# Patient Record
Sex: Female | Born: 1980 | Race: Black or African American | Hispanic: No | Smoking: Current every day smoker
Health system: Southern US, Community
[De-identification: ages and names within clinical notes are randomized; demographics above are authoritative.]

---

## 2019-08-19 ENCOUNTER — Emergency Department (HOSPITAL_COMMUNITY): Payer: Self-pay

## 2019-08-19 ENCOUNTER — Other Ambulatory Visit: Payer: Self-pay

## 2019-08-19 ENCOUNTER — Encounter (HOSPITAL_COMMUNITY): Payer: Self-pay | Admitting: Emergency Medicine

## 2019-08-19 ENCOUNTER — Emergency Department (HOSPITAL_COMMUNITY)
Admission: EM | Admit: 2019-08-19 | Discharge: 2019-08-19 | Disposition: A | Discharge status: Home / Self Care | Payer: Self-pay | Attending: Emergency Medicine | Admitting: Emergency Medicine

## 2019-08-19 DIAGNOSIS — F17210 Nicotine dependence, cigarettes, uncomplicated: Secondary | ICD-10-CM | POA: Insufficient documentation

## 2019-08-19 DIAGNOSIS — F1210 Cannabis abuse, uncomplicated: Secondary | ICD-10-CM | POA: Insufficient documentation

## 2019-08-19 DIAGNOSIS — M7051 Other bursitis of knee, right knee: Secondary | ICD-10-CM | POA: Insufficient documentation

## 2019-08-19 DIAGNOSIS — Y939 Activity, unspecified: Secondary | ICD-10-CM | POA: Insufficient documentation

## 2019-08-19 LAB — I-STAT BETA HCG BLOOD, ED (MC, WL, AP ONLY): I-stat hCG, quantitative: <5 m[IU]/mL (ref <5)

## 2019-08-19 MED ORDER — IBUPROFEN 600 MG PO TABS: 600.0000 mg | ORAL_TABLET | Freq: Four times a day (QID) | ORAL | 0 refills | Status: AC | PRN

## 2019-08-19 MED ORDER — KETOROLAC TROMETHAMINE 2 % EX GEL: CUTANEOUS | 0 refills | Status: AC

## 2019-08-19 NOTE — ED Provider Notes (Signed)
[facility] Note   CSN: [id number] Arrival date & time: 08/19/19  0719     History   Chief Complaint Chief Complaint  Patient presents with  . Joint Swelling    HPI Deborah Gay is a 38 y.o. female.     The history is provided by the patient. No language interpreter was used.     [age] year old female presenting for evaluation of right knee swelling.  Patient report for the past 5 days she noticed increased swelling to both knees right greater than left.  She described pain as a throbbing sensation, worse with prolonged standing with walking and moderate in severity.  She noticed that the swelling in her left knee has decreased in size and more tolerable but the right side has not.  She did not complain of any associated fever, no IV drug use, no back pain or ankle pain and no recent injury.  She has been using Biofreeze cream at home without adequate relief.  She denies any chest pain or shortness of breath no prior history of PE or DVT no recent surgery, prolonged bedrest, active cancer, or taking oral birth control pill.  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  History reviewed. No pertinent past medical history.  There are no active problems to display for this patient.   History reviewed. No pertinent surgical history.   OB History   No obstetric history on file.      Home Medications    Prior to Admission medications   Not on File    Family History History reviewed. No pertinent family history.  Social History Social History   Tobacco Use  . Smoking status: Current Every Day Smoker    Packs/day: 1.00  Substance Use Topics  . Alcohol use: Never    Frequency: Never  . Drug use: Yes    Types: Marijuana    Comment: occasional     Allergies   Patient has no known allergies.   Review of Systems Review of Systems  Constitutional: Negative for fever.  Musculoskeletal: Positive  for joint swelling.  Neurological: Negative for numbness.     Physical Exam Updated Vital Signs BP (!) 192/110 (BP Location: Left Arm)   Pulse 91   Temp 98.7 F (37.1 C) (Oral)   Resp 18   Ht 5\' 7"  (1.702 m)   Wt 102.1 kg   LMP 08/17/2019   SpO2 100%   BMI 35.24 kg/m   Physical Exam Vitals signs and nursing note reviewed.  Constitutional:      General: She is not in acute distress.    Appearance: She is well-developed.  HENT:     Head: Atraumatic.  Eyes:     Conjunctiva/sclera: Conjunctivae normal.  Neck:     Musculoskeletal: Neck supple.  Musculoskeletal:        General: Swelling (Right knee: Moderate edema noted to suprapatellar region on palpation without warmth or erythema.  Normal knee flexion extension, no joint laxity, patella located) present.     Comments: Leg compartment is soft.  Left knee nontender to palpation.  Skin:    Findings: No rash.  Neurological:     Mental Status: She is alert.      ED Treatments / Results  Labs (all labs ordered are listed, but only abnormal results are displayed) Labs Reviewed  I-STAT BETA HCG BLOOD, ED ([facility], [facility], [facility] ONLY)    EKG None  Radiology Dg Knee Complete 4  Views Right  Result Date: 08/19/2019 CLINICAL DATA:  Right knee pain and swelling. EXAM: RIGHT KNEE - COMPLETE 4+ VIEW COMPARISON:  None. FINDINGS: No evidence of fracture or dislocation. Large knee joint effusion is seen. Mild medial compartment degenerative spurring is noted without significant joint space narrowing. No other bone lesions identified. IMPRESSION: 1. Large knee joint effusion. 2. Mild medial compartment degenerative spurring. Electronically Signed   By: Marlaine Hind M.D.   On: 08/19/2019 08:25    Procedures Procedures (including critical care time)  Medications Ordered in ED Medications - No data to display   Initial Impression / Assessment and Plan / ED Course  I have reviewed the triage vital signs and the nursing notes.  Pertinent  labs & imaging results that were available during my care of the patient were reviewed by me and considered in my medical decision making (see chart for details).        BP (!) 151/91 (BP Location: Left Arm)   Pulse 91   Temp 98.7 F (37.1 C) (Oral)   Resp 18   Ht 5\' 7"  (1.702 m)   Wt 102.1 kg   LMP 08/17/2019   SpO2 100%   BMI 35.24 kg/m    Final Clinical Impressions(s) / ED Diagnoses   Final diagnoses:  Suprapatellar bursitis of right knee    ED Discharge Orders         Ordered    ibuprofen (ADVIL) 600 MG tablet  Every 6 hours PRN     08/19/19 1011    Ketorolac Tromethamine 2 % GEL     08/19/19 1011         9:57 AM Patient here with atraumatic right knee pain and swelling.  Initial complaint was bilateral knee pain right greater than left.  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  X-ray of R knee demonstrated a large knee effusion.  Effusion is most noticeable to the suprapatellar region suggestive of potential suprapatellar bursitis.  No evidence to suggest septic joint.  No recent injury to suggest fracture or dislocation.  Will provide knee sleeves, rice therapy, and orthopedic referral.  Work note provided.   Domenic Moras, PA-C 08/19/19 1012    Maudie Flakes, MD 08/20/19 [phone]

## 2019-08-19 NOTE — ED Triage Notes (Signed)
Pt reports right knee swelling and calf pain for the last 2 days. Denies recent fever/ injury. Distal circulation intact.

## 2021-03-27 IMAGING — CR DG KNEE COMPLETE 4+V*R*
4 series · 4 of 4 positions shown · non-contrast
Comparison: None.

CLINICAL DATA: Right knee pain and swelling.

EXAM:
RIGHT KNEE - COMPLETE 4+ VIEW

[knee ap]
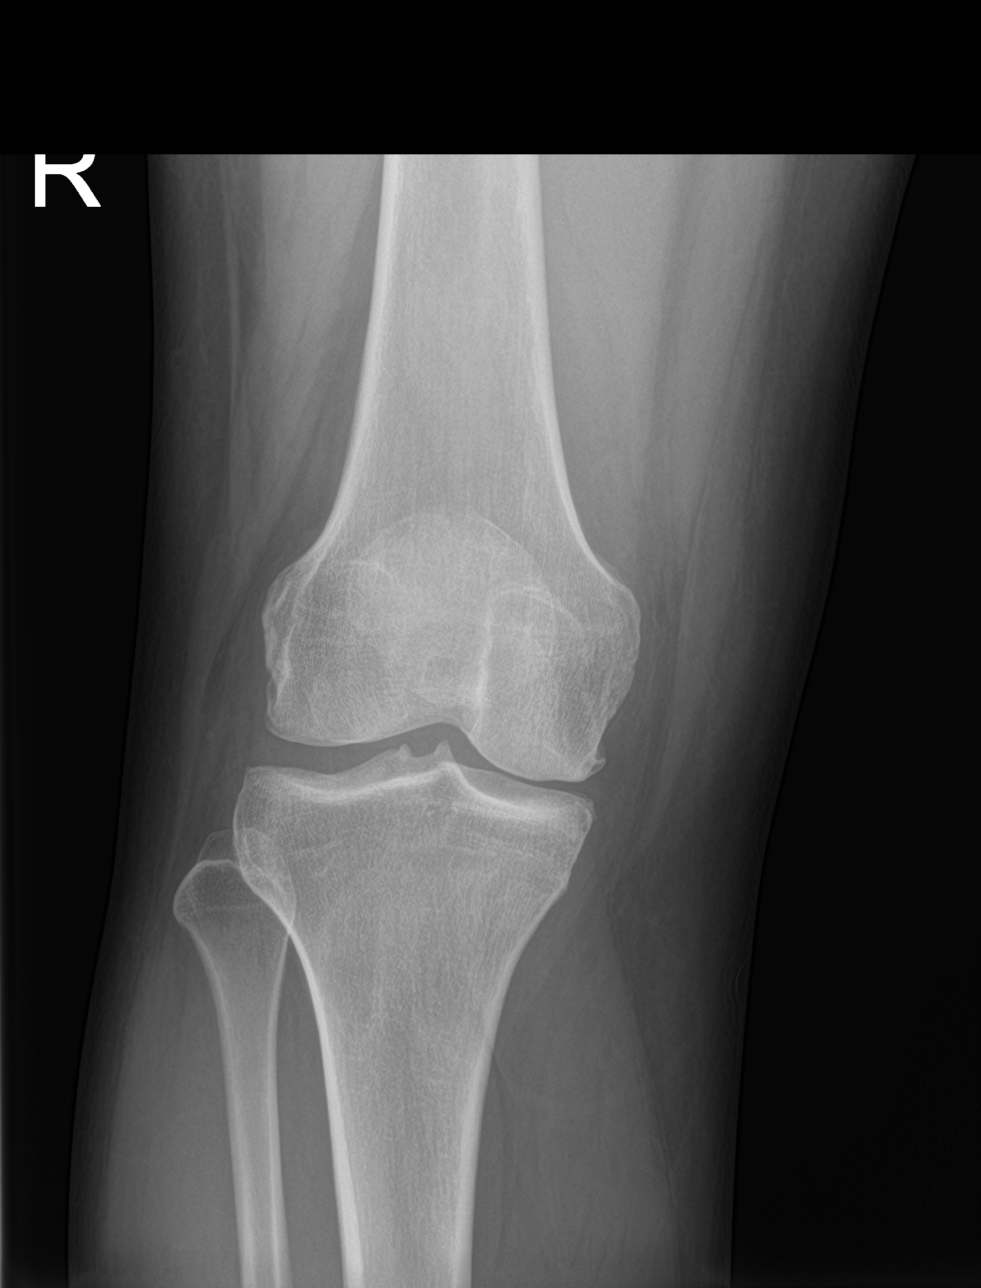

[knee lat]
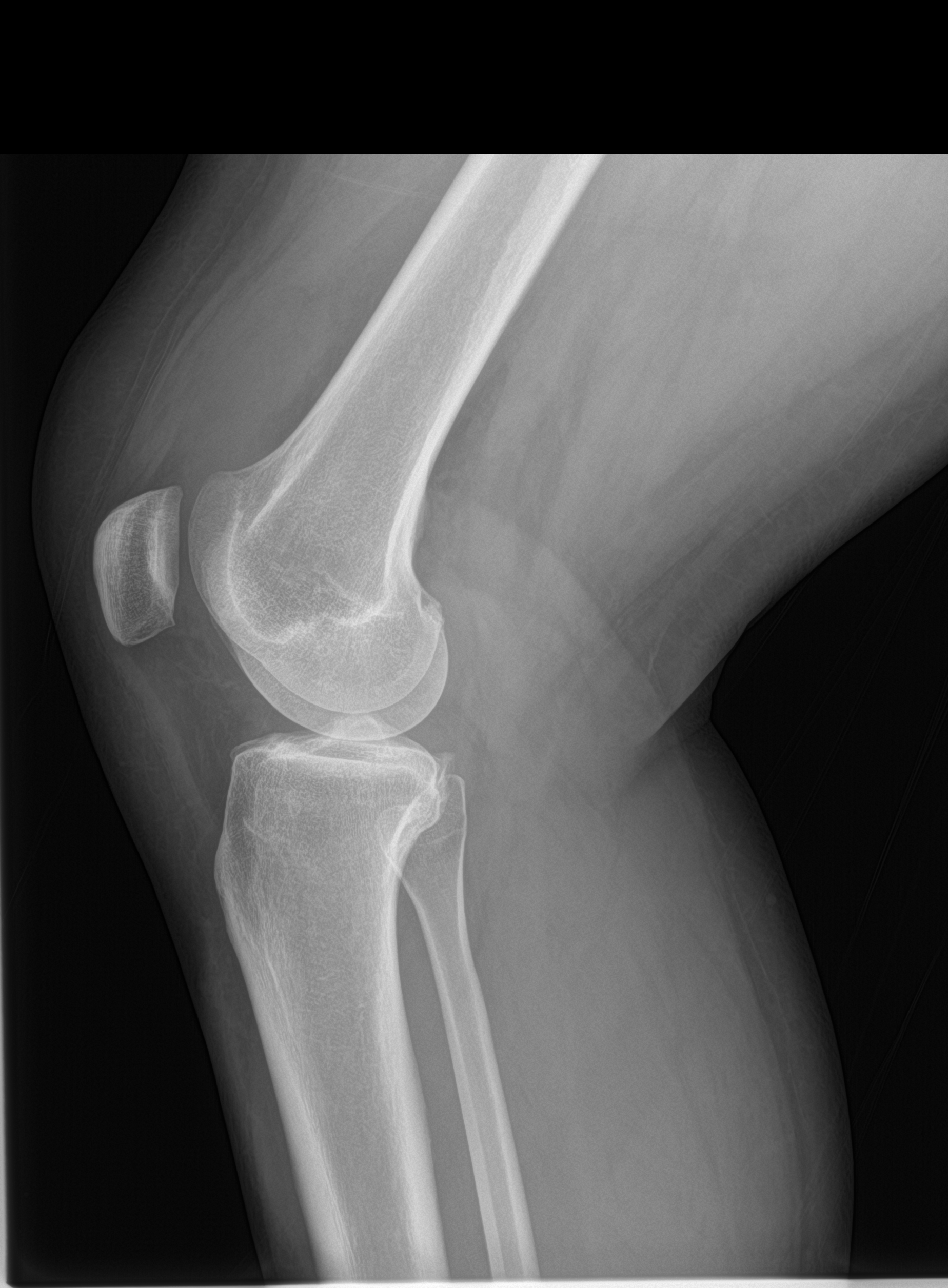

[knee obl (1 of 2)]
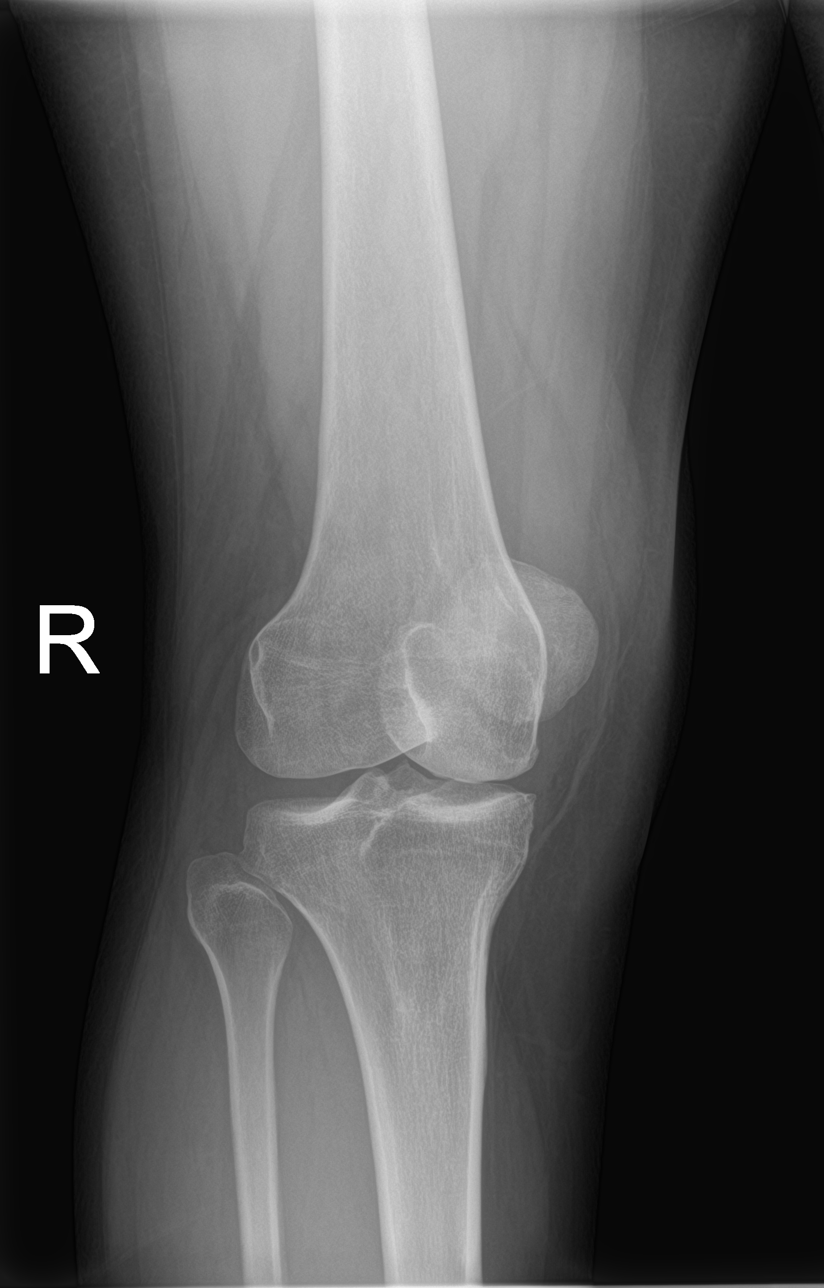

[knee obl (2 of 2)]
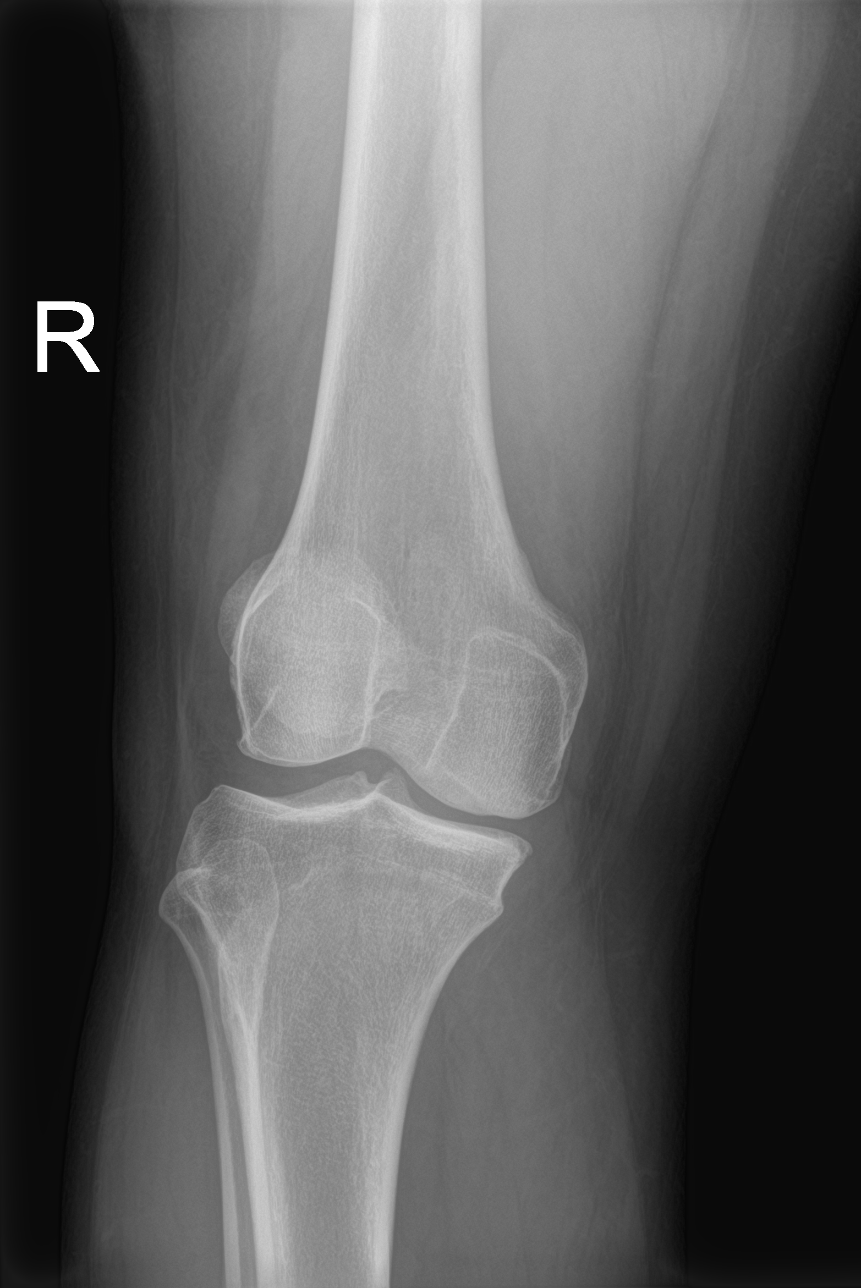

[4 of 4 positions shown; findings below may reference images not displayed]

FINDINGS: No evidence of fracture or dislocation. Large knee joint effusion is
seen. Mild medial compartment degenerative spurring is noted without
significant joint space narrowing. No other bone lesions identified.
IMPRESSION: 1. Large knee joint effusion.
2. Mild medial compartment degenerative spurring.

## 2021-10-05 ENCOUNTER — Other Ambulatory Visit: Payer: Self-pay

## 2021-10-05 ENCOUNTER — Emergency Department (HOSPITAL_COMMUNITY)
Admission: EM | Admit: 2021-10-05 | Discharge: 2021-10-05 | Disposition: A | Discharge status: Home / Self Care | Payer: Self-pay | Attending: Emergency Medicine | Admitting: Emergency Medicine

## 2021-10-05 DIAGNOSIS — F17210 Nicotine dependence, cigarettes, uncomplicated: Secondary | ICD-10-CM | POA: Insufficient documentation

## 2021-10-05 DIAGNOSIS — R310 Gross hematuria: Secondary | ICD-10-CM | POA: Insufficient documentation

## 2021-10-05 LAB — URINALYSIS, ROUTINE W REFLEX MICROSCOPIC
Bilirubin Urine: NEGATIVE
Glucose, UA: NEGATIVE mg/dL
Ketones, ur: NEGATIVE mg/dL
Leukocytes,Ua: NEGATIVE
Nitrite: NEGATIVE
Protein, ur: 100 mg/dL — AB
RBC / HPF: >50 RBC/hpf — ABNORMAL HIGH (ref 0–5)
Specific Gravity, Urine: 1.013 (ref 1.005–1.030)
pH: 6 (ref 5.0–8.0)

## 2021-10-05 LAB — PREGNANCY, URINE: Preg Test, Ur: NEGATIVE

## 2021-10-05 MED ORDER — CEPHALEXIN 500 MG PO CAPS: 500.0000 mg | ORAL_CAPSULE | Freq: Three times a day (TID) | ORAL | 0 refills | Status: AC

## 2021-10-05 NOTE — ED Triage Notes (Signed)
Pt with urinary frequency, burning with urination, and hematuria since Sunday.

## 2021-10-07 LAB — URINE CULTURE

## 2021-10-07 NOTE — ED Provider Notes (Signed)
[facility] EMERGENCY DEPARTMENT Provider Note   CSN: [id number] Arrival date & time: 10/05/21  1445     History Chief Complaint  Patient presents with   Hematuria    Deborah Gay is a 40 y.o. female.  HPI     40 year old female with history of urinary frequency and hematuria since Sunday.  Reports that her menses had ended, but after that she developed blood in her urine.  Reports associated frequency, urgency.  Has some associated suprapubic abdominal pain, as well as back pain, but is achy and mild. Feels like a pressure in her bladder an flank that worsens with urination.  She had a history of prior kidney stones in the past, but this does not feel similar.  She also had had previous episodes of hematuria.  Denies continued vaginal bleeding, vaginal discharge.  Denies any fevers.  She has not had diarrhea, constipation.  Notes that her appetite has been lower over the last week.  No fevers or chills.  No past medical history on file.  There are no problems to display for this patient.   No past surgical history on file.   OB History   No obstetric history on file.     No family history on file.  Social History   Tobacco Use   Smoking status: Every Day    Packs/day: 1.00    Types: Cigarettes  Substance Use Topics   Alcohol use: Never   Drug use: Yes    Types: Marijuana    Comment: occasional    Home Medications Prior to Admission medications   Medication Sig Start Date End Date Taking? Authorizing Provider  cephALEXin (KEFLEX) 500 MG capsule Take 1 capsule (500 mg total) by mouth 3 (three) times daily for 7 days. 10/05/21 10/12/21 Yes Alvira Monday, MD  Cholecalciferol (VITAMIN D3) 1.25 MG (50000 UT) CAPS Take 1.25 mg by mouth daily.   Yes [provider]  Flaxseed, Linseed, (FLAX SEED OIL) 1000 MG CAPS Take 1-2 capsules by mouth daily.   Yes [provider]  ibuprofen (ADVIL) 600 MG tablet Take 1 tablet (600 mg  total) by mouth every 6 (six) hours as needed. 08/19/19  Yes Fayrene Helper, PA-C  Maca Root 500 MG CAPS Take 1-2 capsules by mouth daily.   Yes [provider]  Magnesium 100 MG TABS Take by mouth.   Yes [provider]  Oil of Oregano 1500 MG CAPS Take 1-2 capsules by mouth daily.   Yes [provider]  OVER THE COUNTER MEDICATION Take 1 capsule by mouth daily. Ashwaganda vitamin   Yes [provider]  Zinc 20 MG CAPS Take 1 capsule by mouth daily.   Yes [provider]  Ketorolac Tromethamine 2 % GEL Apply gel to affected knee four times daily as needed for pain and swelling. Patient not taking: No sig reported 08/19/19   Fayrene Helper, PA-C    Allergies    Patient has no known allergies.  Review of Systems   Review of Systems  Constitutional:  Negative for fever.  HENT:  Negative for sore throat.   Respiratory:  Negative for shortness of breath.   Cardiovascular:  Negative for chest pain.  Gastrointestinal:  Positive for abdominal pain. Negative for constipation, diarrhea, nausea and vomiting.  Genitourinary:  Positive for flank pain, frequency, hematuria and urgency. Negative for difficulty urinating.  Musculoskeletal:  Negative for back pain and neck pain.  Skin:  Negative for rash.  Neurological:  Negative  for syncope.   Physical Exam Updated Vital Signs BP (!) 149/101 (BP Location: Right Arm)   Pulse 73   Temp 98.9 F (37.2 C) (Oral)   Resp 18   SpO2 100%   Physical Exam Vitals and nursing note reviewed.  Constitutional:      General: She is not in acute distress.    Appearance: Normal appearance. She is not ill-appearing, toxic-appearing or diaphoretic.  HENT:     Head: Normocephalic.  Eyes:     Conjunctiva/sclera: Conjunctivae normal.  Cardiovascular:     Rate and Rhythm: Normal rate and regular rhythm.     Pulses: Normal pulses.  Pulmonary:     Effort: Pulmonary effort is normal. No respiratory distress.  Abdominal:      Tenderness: There is abdominal tenderness (supraubic).  Musculoskeletal:        General: No deformity or signs of injury.     Cervical back: No rigidity.  Skin:    General: Skin is warm and dry.     Coloration: Skin is not jaundiced or pale.  Neurological:     General: No focal deficit present.     Mental Status: She is alert and oriented to person, place, and time.    ED Results / Procedures / Treatments   Labs (all labs ordered are listed, but only abnormal results are displayed) Labs Reviewed  URINALYSIS, ROUTINE W REFLEX MICROSCOPIC - Abnormal; Notable for the following components:      Result Value   Color, Urine AMBER (*)    APPearance CLOUDY (*)    Hgb urine dipstick MODERATE (*)    Protein, ur 100 (*)    RBC / HPF >50 (*)    Bacteria, UA FEW (*)    All other components within normal limits  URINE CULTURE  PREGNANCY, URINE    EKG None  Radiology No results found.  Procedures Procedures   Medications Ordered in ED Medications - No data to display  ED Course  I have reviewed the triage vital signs and the nursing notes.  Pertinent labs & imaging results that were available during my care of the patient were reviewed by me and considered in my medical decision making (see chart for details).    MDM Rules/Calculators/A&P                            40 year old female with history of urinary frequency and hematuria since Sunday.  Pregnancy test negative. Urinalysis with greater than 50 RBC, however negative leukocytes, few bacteria. Discussed not clearly UTI with few bacteria, but will send for culture and feel it is reasonable to empirically treat. Discussed possibility of labs and imaging to evaluate for signs of nephrolithiasis or other abnormalities, however she declines and given low suspicion for appendicitis, or other acute surgical emergency feel it is not unreasonable for her to follow up with her physician as an outpatient as well as Urology given  hematuria without infection (and repeat episodes per pt.) Discussed strict return precautions. Patient discharged in stable condition with understanding of reasons to return.    Final Clinical Impression(s) / ED Diagnoses Final diagnoses:  Gross hematuria    Rx / DC Orders ED Discharge Orders          Ordered    cephALEXin (KEFLEX) 500 MG capsule  3 times daily        10 /21/22 2011  Alvira Monday, MD 10/07/21 1106

## 2021-10-21 ENCOUNTER — Emergency Department (HOSPITAL_BASED_OUTPATIENT_CLINIC_OR_DEPARTMENT_OTHER)
Admission: EM | Admit: 2021-10-21 | Discharge: 2021-10-21 | Disposition: A | Discharge status: Home / Self Care | Payer: Self-pay | Attending: Emergency Medicine | Admitting: Emergency Medicine

## 2021-10-21 ENCOUNTER — Other Ambulatory Visit: Payer: Self-pay

## 2021-10-21 ENCOUNTER — Encounter (HOSPITAL_BASED_OUTPATIENT_CLINIC_OR_DEPARTMENT_OTHER): Payer: Self-pay | Admitting: *Deleted

## 2021-10-21 ENCOUNTER — Emergency Department (HOSPITAL_BASED_OUTPATIENT_CLINIC_OR_DEPARTMENT_OTHER): Payer: Self-pay

## 2021-10-21 DIAGNOSIS — R310 Gross hematuria: Secondary | ICD-10-CM | POA: Insufficient documentation

## 2021-10-21 DIAGNOSIS — D649 Anemia, unspecified: Secondary | ICD-10-CM | POA: Insufficient documentation

## 2021-10-21 DIAGNOSIS — F17210 Nicotine dependence, cigarettes, uncomplicated: Secondary | ICD-10-CM | POA: Insufficient documentation

## 2021-10-21 LAB — URINALYSIS, ROUTINE W REFLEX MICROSCOPIC

## 2021-10-21 LAB — COMPREHENSIVE METABOLIC PANEL
ALT: 13 U/L (ref 0–44)
AST: 17 U/L (ref 15–41)
Albumin: 3.9 g/dL (ref 3.5–5.0)
Alkaline Phosphatase: 87 U/L (ref 38–126)
Anion gap: 6 (ref 5–15)
BUN: 9 mg/dL (ref 6–20)
CO2: 25 mmol/L (ref 22–32)
Calcium: 8.9 mg/dL (ref 8.9–10.3)
Chloride: 106 mmol/L (ref 98–111)
Creatinine, Ser: 0.99 mg/dL (ref 0.44–1.00)
GFR, Estimated: >60 mL/min (ref >60)
Glucose, Bld: 99 mg/dL (ref 70–99)
Potassium: 3.4 mmol/L — ABNORMAL LOW (ref 3.5–5.1)
Sodium: 137 mmol/L (ref 135–145)
Total Bilirubin: 0.4 mg/dL (ref 0.3–1.2)
Total Protein: 8.4 g/dL — ABNORMAL HIGH (ref 6.5–8.1)

## 2021-10-21 LAB — CBC
HCT: 24.5 % — ABNORMAL LOW (ref 36.0–46.0)
Hemoglobin: 6.1 g/dL — CL (ref 12.0–15.0)
MCH: 15.4 pg — ABNORMAL LOW (ref 26.0–34.0)
MCHC: 24.9 g/dL — ABNORMAL LOW (ref 30.0–36.0)
MCV: 61.7 fL — ABNORMAL LOW (ref 80.0–100.0)
Platelets: 408 10*3/uL — ABNORMAL HIGH (ref 150–400)
RBC: 3.97 MIL/uL (ref 3.87–5.11)
RDW: 23.5 % — ABNORMAL HIGH (ref 11.5–15.5)
WBC: 8.9 10*3/uL (ref 4.0–10.5)
nRBC: 0 % (ref 0.0–0.2)

## 2021-10-21 LAB — URINALYSIS, MICROSCOPIC (REFLEX): RBC / HPF: >50 RBC/hpf (ref 0–5)

## 2021-10-21 LAB — LIPASE, BLOOD: Lipase: 39 U/L (ref 11–51)

## 2021-10-21 LAB — PREGNANCY, URINE: Preg Test, Ur: NEGATIVE

## 2021-10-21 NOTE — ED Notes (Signed)
Discharge instructions discussed with pt. Pt understands she has refused her further workup and likely admission. Pt states understanding and wanting to see PCP. Pt s/o at  bedside

## 2021-10-21 NOTE — ED Provider Notes (Addendum)
[facility] EMERGENCY DEPARTMENT Provider Note   CSN: [id number] Arrival date & time: 10/21/21  1843     History Chief Complaint  Patient presents with   Hematuria    Deborah Gay is a 40 y.o. female.  Patient with a history of pica and eats starch daily.  Patient seen October 21 for blood in her urine.  Urine culture was sent without any specific urinary tract findings.  Just had culture with multiple species.  Patient did have blood in her urine at that time.  Patient treated with Keflex.  Patient states that improved some but now is come back.  She also has a history of heavy vaginal periods every other 1.  Patient states she is about ready to start her period now.  She has not followed by primary care or OB/GYN.  Patient denies feeling dizzy or lightheaded or feeling like she is got a passout.      History reviewed. No pertinent past medical history.  There are no problems to display for this patient.   History reviewed. No pertinent surgical history.   OB History   No obstetric history on file.     No family history on file.  Social History   Tobacco Use   Smoking status: Every Day    Packs/day: 1.00    Types: Cigarettes   Smokeless tobacco: Never  Substance Use Topics   Alcohol use: Never   Drug use: Yes    Types: Marijuana    Comment: occasional    Home Medications Prior to Admission medications   Medication Sig Start Date End Date Taking? Authorizing Provider  Cholecalciferol (VITAMIN D3) 1.25 MG (50000 UT) CAPS Take 1.25 mg by mouth daily.    [provider]  Flaxseed, Linseed, (FLAX SEED OIL) 1000 MG CAPS Take 1-2 capsules by mouth daily.    [provider]  ibuprofen (ADVIL) 600 MG tablet Take 1 tablet (600 mg total) by mouth every 6 (six) hours as needed. 08/19/19   Domenic Moras, PA-C  Ketorolac Tromethamine 2 % GEL Apply gel to affected knee four times daily as needed for pain and swelling. Patient not taking: No sig  reported 08/19/19   Domenic Moras, PA-C  Maca Root 500 MG CAPS Take 1-2 capsules by mouth daily.    [provider]  Magnesium 100 MG TABS Take by mouth.    [provider]  Oil of Oregano 1500 MG CAPS Take 1-2 capsules by mouth daily.    [provider]  OVER THE COUNTER MEDICATION Take 1 capsule by mouth daily. Ashwaganda vitamin    [provider]  Zinc 20 MG CAPS Take 1 capsule by mouth daily.    [provider]    Allergies    Patient has no known allergies.  Review of Systems   Review of Systems  Constitutional:  Negative for chills and fever.  HENT:  Negative for ear pain and sore throat.   Eyes:  Negative for pain and visual disturbance.  Respiratory:  Negative for cough and shortness of breath.   Cardiovascular:  Negative for chest pain and palpitations.  Gastrointestinal:  Negative for abdominal pain and vomiting.  Genitourinary:  Positive for hematuria and vaginal bleeding. Negative for dysuria.  Musculoskeletal:  Positive for back pain. Negative for arthralgias.  Skin:  Negative for color change and rash.  Neurological:  Negative for dizziness, seizures, syncope and light-headedness.  All other systems reviewed and are negative.  Physical Exam Updated Vital  Signs BP 130/78   Pulse 94   Temp 98.6 F (37 C) (Oral)   Resp 20   Ht 1.702 m (5\' 7" )   Wt 102 kg   LMP 09/23/2021   SpO2 100%   BMI 35.22 kg/m   Physical Exam Vitals and nursing note reviewed.  Constitutional:      General: She is not in acute distress.    Appearance: Normal appearance. She is well-developed.  HENT:     Head: Normocephalic and atraumatic.  Eyes:     Conjunctiva/sclera: Conjunctivae normal.     Pupils: Pupils are equal, round, and reactive to light.  Cardiovascular:     Rate and Rhythm: Normal rate and regular rhythm.     Heart sounds: No murmur heard. Pulmonary:     Effort: Pulmonary effort is normal. No respiratory distress.     Breath  sounds: Normal breath sounds.  Abdominal:     General: There is no distension.     Palpations: Abdomen is soft.     Tenderness: There is no abdominal tenderness.  Genitourinary:    Comments: Bimanual vaginal exam no blood in the vaginal area.  No cervical motion tenderness.  No uterine tenderness.  And in general no adnexal tenderness. Musculoskeletal:     Cervical back: Normal range of motion and neck supple.  Skin:    General: Skin is warm and dry.     Capillary Refill: Capillary refill takes less than 2 seconds.  Neurological:     General: No focal deficit present.     Mental Status: She is alert and oriented to person, place, and time.     Cranial Nerves: No cranial nerve deficit.     Sensory: No sensory deficit.     Motor: No weakness.    ED Results / Procedures / Treatments   Labs (all labs ordered are listed, but only abnormal results are displayed) Labs Reviewed  COMPREHENSIVE METABOLIC PANEL - Abnormal; Notable for the following components:      Result Value   Potassium 3.4 (*)    Total Protein 8.4 (*)    All other components within normal limits  CBC - Abnormal; Notable for the following components:   Hemoglobin 6.1 (*)    HCT 24.5 (*)    MCV 61.7 (*)    MCH 15.4 (*)    MCHC 24.9 (*)    RDW 23.5 (*)    Platelets 408 (*)    All other components within normal limits  URINALYSIS, ROUTINE W REFLEX MICROSCOPIC - Abnormal; Notable for the following components:   Color, Urine RED (*)    APPearance TURBID (*)    Glucose, UA   (*)    Value: TEST NOT REPORTED DUE TO COLOR INTERFERENCE OF URINE PIGMENT   Hgb urine dipstick   (*)    Value: TEST NOT REPORTED DUE TO COLOR INTERFERENCE OF URINE PIGMENT   Bilirubin Urine   (*)    Value: TEST NOT REPORTED DUE TO COLOR INTERFERENCE OF URINE PIGMENT   Ketones, ur   (*)    Value: TEST NOT REPORTED DUE TO COLOR INTERFERENCE OF URINE PIGMENT   Protein, ur   (*)    Value: TEST NOT REPORTED DUE TO COLOR INTERFERENCE OF URINE  PIGMENT   Nitrite   (*)    Value: TEST NOT REPORTED DUE TO COLOR INTERFERENCE OF URINE PIGMENT   Leukocytes,Ua   (*)    Value: TEST NOT REPORTED DUE TO COLOR INTERFERENCE OF URINE PIGMENT  All other components within normal limits  URINALYSIS, MICROSCOPIC (REFLEX) - Abnormal; Notable for the following components:   Bacteria, UA FEW (*)    All other components within normal limits  RESP PANEL BY RT-PCR (FLU A&B, COVID) ARPGX2  LIPASE, BLOOD  PREGNANCY, URINE  POC OCCULT BLOOD, ED    EKG None  Radiology No results found.  Procedures Procedures   Medications Ordered in ED Medications - No data to display  ED Course  I have reviewed the triage vital signs and the nursing notes.  Pertinent labs & imaging results that were available during my care of the patient were reviewed by me and considered in my medical decision making (see chart for details).    MDM Rules/Calculators/A&P                         CRITICAL CARE Performed by: Vanetta Mulders Total critical care time: 35 minutes Critical care time was exclusive of separately billable procedures and treating other patients. Critical care was necessary to treat or prevent imminent or life-threatening deterioration. Critical care was time spent personally by me on the following activities: development of treatment plan with patient and/or surrogate as well as nursing, discussions with consultants, evaluation of patient's response to treatment, examination of patient, obtaining history from patient or surrogate, ordering and performing treatments and interventions, ordering and review of laboratory studies, ordering and review of radiographic studies, pulse oximetry and re-evaluation of patient's condition.   Patient not toxic.  Not tachycardic.  Not symptomatic for her anemia hemoglobin of 6.1.  This may be secondary to heavy vaginal bleeding may be secondary to the hematuria.  We will reculture her urine it had just multiple  species from her visit on the October 21.  At that visit CBC was not checked.  Urgency test negative today.  Potassium slightly low at 3.4.  Complete metabolic panel without any significant abnormalities.  Lipase normal.  No leukocytosis as we mentioned hemoglobin 6.1.  Platelets are actually little elevated at 408.  Urinalysis did have greater than 50 red blood cells.  Because of the concerns for the heavy vaginal bleeding and then also the hematuria we will go ahead and do CT abdomen pelvis with IV contrast.  Patient will most likely require admission for blood transfusion and further work-up.  CT scan results are important to have a feel for where the problem may be.  Patient refusing any further work-up.  Refusing CT scan abdomen pelvis.  States that she has had low blood counts for ever.  It is true that she is not terribly symptomatic not tachycardic does not feel lightheaded or for like she got a passout.  It is very possible that the anemia could be somewhat chronic.  Patient does not want blood transfusion does not want further work-up says she will go back to her primary care doctor in [place].   Final Clinical Impression(s) / ED Diagnoses Final diagnoses:  Gross hematuria  Anemia, unspecified type    Rx / DC Orders ED Discharge Orders     None        Vanetta Mulders, MD 10/21/21 2257    Vanetta Mulders, MD 10/21/21 [phone]

## 2021-10-21 NOTE — ED Notes (Signed)
Pt states she has a history of anemia. Has had pica and eats starch on the daily basis. Currently does not have a PCP and does not know her normal hgb level. Pt denies dizziness/SOB/weakness. Has not noticed any bleeding besides the blood in her urine. No dark stools/emesis. Denies flank pain at this time. Has a history of kidney stones.

## 2021-10-21 NOTE — ED Notes (Signed)
Pt refused CT scan

## 2021-10-21 NOTE — ED Notes (Signed)
Pt refusing care. States she no longer wants CT scan, covid swab, and admission. Dr Deretha Emory informed and at bedside

## 2021-10-21 NOTE — Discharge Instructions (Signed)
Blood counts were low and you meet criteria for blood transfusion understand you refused that.  We also wanted to do CT scan abdomen and pelvis to find out why you have blood in the urine why you are having heavy periods.  Understand you do not want to have that done and do not want IV admitted at this time.  Follow-up with your primary care doctor return for any new or worse symptoms.

## 2021-10-21 NOTE — ED Triage Notes (Signed)
Completed antibiotics for hematuria several weeks ago. States Sx not improved. Still having bleeding and flank pain

## 2022-10-28 ENCOUNTER — Emergency Department (HOSPITAL_COMMUNITY): Admission: EM | Admit: 2022-10-28 | Discharge: 2022-10-29 | Discharge status: Against Medical Advice | Payer: Self-pay

## 2022-10-28 NOTE — ED Notes (Signed)
No answer

## 2022-10-28 NOTE — ED Notes (Signed)
Pt called multiple times for triage, no answer. 

## 2023-08-07 ENCOUNTER — Other Ambulatory Visit: Payer: Self-pay

## 2023-08-07 ENCOUNTER — Emergency Department (HOSPITAL_COMMUNITY)
Admission: EM | Admit: 2023-08-07 | Discharge: 2023-08-07 | Disposition: A | Discharge status: Home / Self Care | Payer: Self-pay | Attending: Emergency Medicine | Admitting: Emergency Medicine

## 2023-08-07 ENCOUNTER — Encounter (HOSPITAL_COMMUNITY): Payer: Self-pay | Admitting: Emergency Medicine

## 2023-08-07 DIAGNOSIS — K0889 Other specified disorders of teeth and supporting structures: Secondary | ICD-10-CM | POA: Insufficient documentation

## 2023-08-07 MED ORDER — PENICILLIN V POTASSIUM 500 MG PO TABS: 500.0000 mg | ORAL_TABLET | Freq: Three times a day (TID) | ORAL | 0 refills | Status: AC

## 2023-08-07 NOTE — ED Triage Notes (Signed)
Pt reports left sided dental pain. Reports she thinks she has been grinding her teeth at night

## 2023-08-07 NOTE — Discharge Instructions (Signed)
You were evaluated today for dental pain.  I prescribed antibiotics for infection coverage.  I recommend taking 600 mg of ibuprofen every 6 hours.  You may also add up to 1000 mg of Tylenol every 6 hours not to exceed 4000 mg in a 24-hour period.  It is important that you schedule a follow-up appointment with dentistry for definitive management of your dental pain.

## 2023-08-07 NOTE — ED Provider Notes (Signed)
[place] EMERGENCY DEPARTMENT AT [facility] Provider Note   CSN: [id number] Arrival date & time: 08/07/23  0214     History  Chief Complaint  Patient presents with   Dental Pain    Deborah Gay is a 42 y.o. female.  Patient presents to the emergency department complaining of 2 days of upper left-sided dental pain.  Patient states this began on Tuesday morning.  She states that over the past day she has noticed increasing swelling in that area.  She also endorses grinding her teeth over the past day and noticed pieces of tooth in her mouth this morning.  She has a broken tooth in that left upper region and knows she needs dental care but does not currently have a dentist.  She denies fevers, nausea, vomiting, difficulty swallowing, speech changes.  No relevant past medical history on file  HPI     Home Medications Prior to Admission medications   Medication Sig Start Date End Date Taking? Authorizing Provider  penicillin v potassium (VEETID) 500 MG tablet Take 1 tablet (500 mg total) by mouth 3 (three) times daily. 08/07/23  Yes Darrick Grinder, PA-C  Cholecalciferol (VITAMIN D3) 1.25 MG (50000 UT) CAPS Take 1.25 mg by mouth daily.    [provider]  Flaxseed, Linseed, (FLAX SEED OIL) 1000 MG CAPS Take 1-2 capsules by mouth daily.    [provider]  ibuprofen (ADVIL) 600 MG tablet Take 1 tablet (600 mg total) by mouth every 6 (six) hours as needed. 08/19/19   Fayrene Helper, PA-C  Ketorolac Tromethamine 2 % GEL Apply gel to affected knee four times daily as needed for pain and swelling. Patient not taking: No sig reported 08/19/19   Fayrene Helper, PA-C  Maca Root 500 MG CAPS Take 1-2 capsules by mouth daily.    [provider]  Magnesium 100 MG TABS Take by mouth.    [provider]  Oil of Oregano 1500 MG CAPS Take 1-2 capsules by mouth daily.    [provider]  OVER THE COUNTER MEDICATION Take 1 capsule by mouth daily.  Ashwaganda vitamin    [provider]  Zinc 20 MG CAPS Take 1 capsule by mouth daily.    [provider]      Allergies    Patient has no known allergies.    Review of Systems   Review of Systems  Physical Exam Updated Vital Signs BP (!) 156/96   Pulse 85   Temp 98.5 F (36.9 C) (Oral)   Resp 18   SpO2 100%  Physical Exam Vitals and nursing note reviewed.  HENT:     Head: Normocephalic and atraumatic.     Mouth/Throat:      Comments: No drainable abscess appreciated, mild swelling to the gingival region near the broken tooth Cardiovascular:     Rate and Rhythm: Normal rate.  Pulmonary:     Effort: Pulmonary effort is normal. No respiratory distress.  Musculoskeletal:        General: No signs of injury.     Cervical back: Normal range of motion.  Skin:    General: Skin is dry.  Neurological:     Mental Status: She is alert.  Psychiatric:        Speech: Speech normal.        Behavior: Behavior normal.     ED Results / Procedures / Treatments   Labs (all labs ordered are listed, but only abnormal results are displayed) Labs  Reviewed - No data to display  EKG None  Radiology No results found.  Procedures Procedures    Medications Ordered in ED Medications - No data to display  ED Course/ Medical Decision Making/ A&P                                 Medical Decision Making  This patient presents to the ED for concern of dental pain, this involves an extensive number of treatment options, and is a complaint that carries with it a high risk of complications and morbidity.  The differential diagnosis includes abscess, broken tooth, dental cavity, others   Co morbidities that complicate the patient evaluation  None    Social Determinants of Health:  Patient has no health insurance   Test / Admission - Considered:  Patient with a broken tooth and what appears to be a developing infection.  No signs of drainable abscess.  Plan to  discharge home with prescription for penicillin for antibiotic coverage, recommendations for ibuprofen at home, and recommendations for dental follow-up.  Patient states she is able to find a dentist and will follow-up with dentistry as an outpatient.  Discharge home at this time.         Final Clinical Impression(s) / ED Diagnoses Final diagnoses:  Pain, dental    Rx / DC Orders ED Discharge Orders          Ordered    penicillin v potassium (VEETID) 500 MG tablet  3 times daily        08/07/23 0326              Pamala Duffel 08/07/23 0327    Tilden Fossa, MD 08/07/23 2252
